# Patient Record
Sex: Male | Born: 1960 | Race: White | Hispanic: No | Marital: Married | Smoking: Former smoker
Health system: Southern US, Community
[De-identification: ages and names within clinical notes are randomized; demographics above are authoritative.]

## PROBLEM LIST (undated history)

## (undated) DIAGNOSIS — M4690 Unspecified inflammatory spondylopathy, site unspecified: Secondary | ICD-10-CM

## (undated) DIAGNOSIS — M1990 Unspecified osteoarthritis, unspecified site: Secondary | ICD-10-CM

## (undated) DIAGNOSIS — E785 Hyperlipidemia, unspecified: Secondary | ICD-10-CM

## (undated) DIAGNOSIS — I4719 Other supraventricular tachycardia: Secondary | ICD-10-CM

## (undated) DIAGNOSIS — I471 Supraventricular tachycardia: Secondary | ICD-10-CM

## (undated) DIAGNOSIS — K219 Gastro-esophageal reflux disease without esophagitis: Secondary | ICD-10-CM

## (undated) HISTORY — PX: RADIOFREQUENCY ABLATION: SHX2290

---

## 2012-07-02 ENCOUNTER — Emergency Department (HOSPITAL_COMMUNITY): Payer: BC Managed Care – PPO

## 2012-07-02 ENCOUNTER — Encounter (HOSPITAL_COMMUNITY): Payer: Self-pay | Admitting: *Deleted

## 2012-07-02 ENCOUNTER — Observation Stay (HOSPITAL_COMMUNITY)
Admission: EM | Admit: 2012-07-02 | Discharge: 2012-07-03 | Disposition: A | Discharge status: Home / Self Care | Payer: BC Managed Care – PPO | Attending: Internal Medicine | Admitting: Internal Medicine

## 2012-07-02 DIAGNOSIS — I471 Supraventricular tachycardia: Secondary | ICD-10-CM | POA: Insufficient documentation

## 2012-07-02 DIAGNOSIS — F32A Depression, unspecified: Secondary | ICD-10-CM

## 2012-07-02 DIAGNOSIS — R0602 Shortness of breath: Secondary | ICD-10-CM

## 2012-07-02 DIAGNOSIS — K219 Gastro-esophageal reflux disease without esophagitis: Secondary | ICD-10-CM | POA: Diagnosis present

## 2012-07-02 DIAGNOSIS — Z79899 Other long term (current) drug therapy: Secondary | ICD-10-CM | POA: Insufficient documentation

## 2012-07-02 DIAGNOSIS — F3289 Other specified depressive episodes: Secondary | ICD-10-CM | POA: Insufficient documentation

## 2012-07-02 DIAGNOSIS — F329 Major depressive disorder, single episode, unspecified: Secondary | ICD-10-CM | POA: Diagnosis present

## 2012-07-02 DIAGNOSIS — E785 Hyperlipidemia, unspecified: Secondary | ICD-10-CM | POA: Diagnosis present

## 2012-07-02 DIAGNOSIS — D72829 Elevated white blood cell count, unspecified: Secondary | ICD-10-CM

## 2012-07-02 DIAGNOSIS — R030 Elevated blood-pressure reading, without diagnosis of hypertension: Secondary | ICD-10-CM

## 2012-07-02 DIAGNOSIS — R0789 Other chest pain: Principal | ICD-10-CM | POA: Insufficient documentation

## 2012-07-02 DIAGNOSIS — R079 Chest pain, unspecified: Secondary | ICD-10-CM | POA: Diagnosis present

## 2012-07-02 HISTORY — DX: Unspecified osteoarthritis, unspecified site: M19.90

## 2012-07-02 HISTORY — DX: Gastro-esophageal reflux disease without esophagitis: K21.9

## 2012-07-02 HISTORY — DX: Supraventricular tachycardia: I47.1

## 2012-07-02 HISTORY — DX: Hyperlipidemia, unspecified: E78.5

## 2012-07-02 HISTORY — DX: Other supraventricular tachycardia: I47.19

## 2012-07-02 HISTORY — DX: Unspecified inflammatory spondylopathy, site unspecified: M46.90

## 2012-07-02 LAB — CBC WITH DIFFERENTIAL/PLATELET
Eosinophils Relative: 0 % (ref 0–5)
HCT: 42.4 % (ref 39.0–52.0)
Hemoglobin: 15.3 g/dL (ref 13.0–17.0)
Lymphocytes Relative: 8 % — ABNORMAL LOW (ref 12–46)
Lymphs Abs: 1.1 10*3/uL (ref 0.7–4.0)
MCV: 90.4 fL (ref 78.0–100.0)
Monocytes Absolute: 1 10*3/uL (ref 0.1–1.0)
Neutro Abs: 10.6 10*3/uL — ABNORMAL HIGH (ref 1.7–7.7)
RBC: 4.69 MIL/uL (ref 4.22–5.81)
WBC: 12.7 10*3/uL — ABNORMAL HIGH (ref 4.0–10.5)

## 2012-07-02 LAB — CBC
HCT: 44.2 % (ref 39.0–52.0)
Hemoglobin: 15.6 g/dL (ref 13.0–17.0)
MCHC: 35.3 g/dL (ref 30.0–36.0)
RBC: 4.8 MIL/uL (ref 4.22–5.81)
WBC: 16.8 10*3/uL — ABNORMAL HIGH (ref 4.0–10.5)

## 2012-07-02 LAB — URINALYSIS, ROUTINE W REFLEX MICROSCOPIC
Bilirubin Urine: NEGATIVE
Glucose, UA: NEGATIVE mg/dL
Ketones, ur: NEGATIVE mg/dL
Leukocytes, UA: NEGATIVE
Nitrite: NEGATIVE
Specific Gravity, Urine: 1.025 (ref 1.005–1.030)
pH: 6.5 (ref 5.0–8.0)

## 2012-07-02 LAB — CARDIAC PANEL(CRET KIN+CKTOT+MB+TROPI)
CK, MB: 1.9 ng/mL (ref 0.3–4.0)
Relative Index: 1.5 (ref 0.0–2.5)
Troponin I: <0.3 ng/mL (ref <0.30)

## 2012-07-02 LAB — BASIC METABOLIC PANEL
BUN: 17 mg/dL (ref 6–23)
CO2: 25 mEq/L (ref 19–32)
Chloride: 98 mEq/L (ref 96–112)
Glucose, Bld: 136 mg/dL — ABNORMAL HIGH (ref 70–99)
Potassium: 3.6 mEq/L (ref 3.5–5.1)

## 2012-07-02 LAB — POCT I-STAT TROPONIN I

## 2012-07-02 MED ORDER — ACETAMINOPHEN 325 MG PO TABS: 650.0000 mg | ORAL_TABLET | Freq: Four times a day (QID) | ORAL | Status: DC | PRN

## 2012-07-02 MED ORDER — MORPHINE SULFATE 2 MG/ML IJ SOLN: 1.0000 mg | INTRAMUSCULAR | Status: DC | PRN

## 2012-07-02 MED ORDER — ENOXAPARIN SODIUM 40 MG/0.4ML ~~LOC~~ SOLN
40.0000 mg | SUBCUTANEOUS | Status: DC
  Administered 2012-07-02: 40 mg via SUBCUTANEOUS
  Filled 2012-07-02 (×2): qty 0.4

## 2012-07-02 MED ORDER — ASPIRIN 325 MG PO TABS
325.0000 mg | ORAL_TABLET | ORAL | Status: AC
  Administered 2012-07-02: 325 mg via ORAL
  Filled 2012-07-02: qty 1

## 2012-07-02 MED ORDER — ONDANSETRON HCL 4 MG/2ML IJ SOLN: 4.0000 mg | Freq: Four times a day (QID) | INTRAMUSCULAR | Status: DC | PRN

## 2012-07-02 MED ORDER — ZOLPIDEM TARTRATE 5 MG PO TABS: 5.0000 mg | ORAL_TABLET | Freq: Every evening | ORAL | Status: DC | PRN

## 2012-07-02 MED ORDER — ADULT MULTIVITAMIN W/MINERALS CH
1.0000 | ORAL_TABLET | Freq: Every day | ORAL | Status: DC
Start: 2012-07-03 — End: 2012-07-03
  Administered 2012-07-03: 1 via ORAL
  Filled 2012-07-02: qty 1

## 2012-07-02 MED ORDER — ALBUTEROL SULFATE (5 MG/ML) 0.5% IN NEBU: 2.5000 mg | INHALATION_SOLUTION | RESPIRATORY_TRACT | Status: DC | PRN

## 2012-07-02 MED ORDER — ACETAMINOPHEN 650 MG RE SUPP: 650.0000 mg | Freq: Four times a day (QID) | RECTAL | Status: DC | PRN

## 2012-07-02 MED ORDER — ESCITALOPRAM OXALATE 10 MG PO TABS
10.0000 mg | ORAL_TABLET | Freq: Two times a day (BID) | ORAL | Status: DC
  Administered 2012-07-02 – 2012-07-03 (×2): 10 mg via ORAL
  Filled 2012-07-02 (×3): qty 1

## 2012-07-02 MED ORDER — ONDANSETRON HCL 4 MG PO TABS: 4.0000 mg | ORAL_TABLET | Freq: Four times a day (QID) | ORAL | Status: DC | PRN

## 2012-07-02 MED ORDER — NITROGLYCERIN 0.4 MG SL SUBL
0.4000 mg | SUBLINGUAL_TABLET | SUBLINGUAL | Status: DC | PRN
  Administered 2012-07-02: 0.4 mg via SUBLINGUAL

## 2012-07-02 MED ORDER — ATORVASTATIN CALCIUM 10 MG PO TABS
10.0000 mg | ORAL_TABLET | Freq: Every day | ORAL | Status: DC
  Filled 2012-07-02: qty 1

## 2012-07-02 MED ORDER — ALUM & MAG HYDROXIDE-SIMETH 200-200-20 MG/5ML PO SUSP: 30.0000 mL | Freq: Four times a day (QID) | ORAL | Status: DC | PRN

## 2012-07-02 MED ORDER — SODIUM CHLORIDE 0.9 % IJ SOLN
3.0000 mL | Freq: Two times a day (BID) | INTRAMUSCULAR | Status: DC
  Administered 2012-07-02 – 2012-07-03 (×2): 3 mL via INTRAVENOUS

## 2012-07-02 MED ORDER — ASPIRIN EC 81 MG PO TBEC
81.0000 mg | DELAYED_RELEASE_TABLET | Freq: Every day | ORAL | Status: DC
  Administered 2012-07-02 – 2012-07-03 (×2): 81 mg via ORAL
  Filled 2012-07-02 (×2): qty 1

## 2012-07-02 MED ORDER — ZOLPIDEM TARTRATE 10 MG PO TABS
10.0000 mg | ORAL_TABLET | Freq: Once | ORAL | Status: AC
  Administered 2012-07-02: 10 mg via ORAL
  Filled 2012-07-02: qty 1

## 2012-07-02 NOTE — Progress Notes (Signed)
Report given to [place], Charity fundraiser . Questions answered. Pt ready for transport

## 2012-07-02 NOTE — ED Notes (Signed)
MD at bedside. 

## 2012-07-02 NOTE — Progress Notes (Signed)
[facility] ED CM reviewed EPIC labs, imaging and notes

## 2012-07-02 NOTE — Consult Note (Signed)
Consult Note    Patient ID: Aaron Barry MRN: [id number], DOB/AGE: 01-Dec-1961   Admit date: 07/02/2012 Date of Consult: 07/02/2012   Primary Physician: Roland Rack, MD Primary Cardiologist: New to cardiology  Pt. Profile: Aaron Barry is a 51yo male with PMHx significant for SVT (s/p RFA x 2 in "early 1990s" at [facility]), hyperlipidemia, GERD and spondylitis who presents to [facility] ED with c/o chest pain.   HPI:   The patient reports sitting at his desk this morning and suddenly experiencing left-sided burning chest pain without radiation rated at a 7-8/10 lasting for several minutes with associated chills, myalgias, arthralgias, diaphoresis, pallor and shortness of breath. No association with exertion. No prior episodes. He denies experiencing these same associated symptoms leading up to today. He denies palpitations, lightheadedness, fevers, n/v, PND, LE edema, orthopnea, new cough. He took an indomethacin tablet without relief. The pain remitted to a 4-5/10 and recurred at the same intensity, thus prompting his presentation to [facility] ED. He denies unilateral leg swelling, redness or tenderness. He denies active bleeding. No wheezing. No tobacco abuse. Occasional EtOH. No elicit drugs. No family history of cardiac issues. He is fairly active at baseline. He runs 2-3 miles a day without complaints of chest pain, DOE or lightheadedness. He does state that very infrequently (once a year) he still endorses tachy-palpitations that improve with rest. He reports undergoing a Lexiscan Myoview 6-7 years ago which was "normal." He states that his cholesterol and thyroid function has not been checked in a while. He does occasionally experience indigestion, but states today's episodes are different.  Upon ED arrival, he received full-dose ASA x 1 and NTG SL with unchanged chest pressure. EKG reveals NSR with no evidence of ischemia. POC troponin-I WNL. CBC reveals a leukocytosis of 16.8. BMET WNL. VSS, afebrile.  Currently chest pain free in NAD.   Problem List: Past Medical History  Diagnosis Date  . Hyperlipemia   . Arthritis   . Spondylitis   . AV nodal re-entry tachycardia     Past Surgical History  Procedure Date  . Radiofrequency ablation 1999    At [facility] for re-entry tachycardia      Allergies: No Known Allergies  Home Medications: Prior to Admission medications   Medication Sig Start Date End Date Taking? Authorizing Provider  atorvastatin (LIPITOR) 10 MG tablet Take 10 mg by mouth daily.   Yes Historical Provider, MD  escitalopram (LEXAPRO) 10 MG tablet Take 10 mg by mouth 2 (two) times daily.   Yes Historical Provider, MD  indomethacin (INDOCIN SR) 75 MG CR capsule Take 75 mg by mouth once.   Yes Historical Provider, MD  Multiple Vitamin (MULTIVITAMIN WITH MINERALS) TABS Take 1 tablet by mouth daily.   Yes Historical Provider, MD    Inpatient Medications:     . aspirin  325 mg Oral STAT    (Not in a hospital admission)  No family history on file.   History   Social History  . Marital Status: Married    Spouse Name: N/A    Number of Children: N/A  . Years of Education: N/A   Occupational History  . Not on file.   Social History Main Topics  . Smoking status: Former Games developer  . Smokeless tobacco: Not on file  . Alcohol Use: Yes     soc  . Drug Use: No  . Sexually Active:    Other Topics Concern  . Not on file   Social History Narrative  . No  narrative on file     Review of Systems: General: positive for chills, diaphoresis, pallor, dry mouth, myalgias, arthralgias, negative for fever, decreased appetitie or weight changes.  Cardiovascular: positive for chest pain, shortness of breath, negative for dyspnea on exertion, edema, orthopnea, palpitations, paroxysmal nocturnal dyspnea Dermatological: negative for rash Respiratory: negative for cough or wheezing Urologic: negative for hematuria Abdominal: negative for nausea, vomiting, diarrhea, bright red  blood per rectum, melena, or hematemesis Neurologic:  negative for visual changes, syncope, or dizziness All other systems reviewed and are otherwise negative except as noted above.  Physical Exam: Blood pressure 114/73, pulse 77, temperature 97.7 F (36.5 C), temperature source Oral, resp. rate 14, SpO2 95.00%.    General: Well developed, well nourished, in no acute distress. Head: Normocephalic, atraumatic, sclera non-icteric, no xanthomas, nares are without discharge.  Neck: Negative for carotid bruits. JVD not elevated. Lungs: Clear bilaterally to auscultation without wheezes, rales, or rhonchi. Breathing is unlabored. Heart: RRR with S1 S2. No murmurs, rubs, or gallops appreciated. Abdomen: Soft, non-tender, non-distended with normoactive bowel sounds. No hepatomegaly. No rebound/guarding. No obvious abdominal masses. Msk:  Strength and tone appears normal for age. Extremities: No clubbing, cyanosis or edema.  Distal pedal pulses are 2+ and equal bilaterally. Neuro:  Alert and oriented X 3. Moves all extremities spontaneously. Psych:  Responds to questions appropriately with a normal affect.  Labs: Recent Labs  Basename 07/02/12 1355   WBC 16.8*   HGB 15.6   HCT 44.2   MCV 92.1   PLT 181   Lab 07/02/12 1355  NA 135  K 3.6  CL 98  CO2 25  BUN 17  CREATININE 1.21  CALCIUM 8.9  PROT --  BILITOT --  ALKPHOS --  ALT --  AST --  AMYLASE --  LIPASE --  GLUCOSE 136*   Radiology/Studies: No results found.  EKG: NSR, 91 bpm, no ST-T wave changes  ASSESSMENT:   1. Chest pain 2. Leukocytosis 3. Hyperlipidemia  4. History of SVT  DISCUSSION/PLAN:   The patient's chest pain is mostly atypical for a cardiac etiology. He is a fairly active individual who runs 2-3 miles daily, and has not endorsed chest pain, DOE or lightheadedness with this. His chest pain occurred suddenly this morning with associated chills, myalgias, arthralgias, dry mouth, pallor and shortness of  breath. Non-exertional and described as burning. No palpitations. He does not relate this to prior reflux type symptoms. EKG reveals no evidence of ischemia. Cardiac biomarkers WNL in the ED. He does have a leukocytosis on CBC. VSS, afebrile. The may likely represent an infectious etiology (flu vs developing bronchitis or pneumonia on DDx). 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 Will call medicine service for admission and further management. Given history of hyperlipidemia and chest pain quality with some typical components, would rule out for ACS. Plan for Myoview in the AM. Will get CXR in the ED. Would assess lipid panel and TSH since these have not been checked in a while. Check A1C. Monitor on telemetry for any instances of arrhythmia given SVT history (unlikely as he rarely experiences tachy-palpitations). Will discuss further recommendations with MD. Will order differential and u/a per medicine request. Patient to be admitted to observation.    Signed, R. Hurman Horn, PA-C 07/02/2012, 4:12 PM    I have seen, examined the patient, and reviewed the above assessment and plan.  Changes to above are made where necessary.  The patient presents with abrupt  chest pain with both typical and atypical features.  Chills, nausea, and myalgias are suggestive of a viral prodrome.  He will be admitted to Internal Medicine for overnight observation. If he rules out for MI, then I would recommend GXT myoview for further cardiac risk stratification in am.  Co Sign: Hillis Range, MD 07/02/2012 9:03 PM

## 2012-07-02 NOTE — ED Notes (Signed)
Sudden onset around 11 am of chest pain, pt c/o dry mouth, light headed, lethargic feeling, generalized weakness, nausea, shortness of breath

## 2012-07-02 NOTE — ED Provider Notes (Signed)
History     CSN: [id number]  Arrival date & time 07/02/12  1330   First MD Initiated Contact with Patient 07/02/12 1427      Chief Complaint  Patient presents with  . Chest Pain    (Consider location/radiation/quality/duration/timing/severity/associated sxs/prior treatment) HPI  51 year old man who presents with acute onset chest pain starting at 11:00 AM. The pain was described as a burning and pressure located to the left of his sternum. It was an 8/10 in intensity. It did not radiate. It was accompanied by diaphoresis, nausea, vomiting, generalized muscle soreness, and chills. He took indomethacin to try to relieve the pain, but it was unsuccessful. The incident occurred while the patient was at work, seated at a desk. He denies any relationship to exertion. He notes stress in the work place and home, but notes that he was not particularly anxious or distressed prior to the event. 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 This was successfully treated with ablation at [facility] in the 1990's. He has not been to see a cardiologist in approximately 10 years. His other risk factor for CAD includes hyperlipidemia. He does not smoke, does not have hypertension, does not have diabetes, and does not have any family history of MI. Currently, the patient notes that this pain has subsided to a 4/10, and he feels extremely fatigued.   Past Medical History  Diagnosis Date  . Hyperlipemia   . Arthritis   . Spondylitis   . AV nodal re-entry tachycardia     Past Surgical History  Procedure Date  . Radiofrequency ablation 1999    At [facility] for re-entry tachycardia     No family history on file.  History  Substance Use Topics  . Smoking status: Former Games developer  . Smokeless tobacco: Not on file  . Alcohol Use: Yes     soc      Review of Systems  Constitutional: Positive for chills, diaphoresis and fatigue. Negative for fever.  HENT: Negative.   Eyes: Negative.    Respiratory: Positive for chest tightness.   Cardiovascular: Positive for chest pain. Negative for palpitations.  Gastrointestinal: Negative.   Genitourinary: Negative.   Musculoskeletal: Positive for arthralgias.  Neurological: Positive for light-headedness.  Hematological: Negative.   Psychiatric/Behavioral: Negative.     Allergies  Review of patient's allergies indicates no known allergies.  Home Medications   Current Outpatient Rx  Name Route Sig Dispense Refill  . ATORVASTATIN CALCIUM 10 MG PO TABS Oral Take 10 mg by mouth daily.    Marland Kitchen ESCITALOPRAM OXALATE 10 MG PO TABS Oral Take 10 mg by mouth 2 (two) times daily.    . INDOMETHACIN ER 75 MG PO CPCR Oral Take 75 mg by mouth once.    . ADULT MULTIVITAMIN W/MINERALS CH Oral Take 1 tablet by mouth daily.      BP 114/73  Pulse 77  Temp 97.7 F (36.5 C) (Oral)  Resp 14  SpO2 95%  Physical Exam  Constitutional: He is oriented to person, place, and time. Vital signs are normal. He appears well-developed and well-nourished. He appears ill. He appears distressed.  HENT:  Head: Normocephalic and atraumatic.  Mouth/Throat: Oropharynx is clear and moist. No oropharyngeal exudate.  Eyes: Conjunctivae and EOM are normal. Pupils are equal, round, and reactive to light.  Neck: Normal range of motion. Neck supple.  Cardiovascular: Normal rate and regular rhythm.   No murmur heard. Pulmonary/Chest: Effort normal and breath  sounds normal. He exhibits no tenderness.  Abdominal: Soft. Bowel sounds are normal.  Musculoskeletal: He exhibits no edema.  Neurological: He is alert and oriented to person, place, and time. He has normal strength and normal reflexes. No cranial nerve deficit.  Skin: No rash noted. He is diaphoretic. There is pallor.    ED Course  Procedures (including critical care time)  Labs Reviewed  CBC - Abnormal; Notable for the following:    WBC 16.8 (*)     All other components within normal limits  BASIC  METABOLIC PANEL - Abnormal; Notable for the following:    Glucose, Bld 136 (*)     GFR calc non Af Amer 67 (*)     GFR calc Af Amer 78 (*)     All other components within normal limits  POCT I-STAT TROPONIN I    Date: 07/02/2012  Rate: 91  Rhythm: normal sinus rhythm  QRS Axis: normal  Intervals: normal  ST/T Wave abnormalities: normal  Conduction Disutrbances:none  Narrative Interpretation: normal ECG  Old EKG Reviewed: none available    1. Chest pain with high risk for cardiac etiology       MDM  51 year old male with history of re-entry tachycardia who presents with chest pain that is suspicious for cardiac origin who would benefit from a ACS rule out and risk stratification. Therefore, he will be admitted to the hospital.         Garnetta Buddy, MD 07/02/12 [id number]

## 2012-07-02 NOTE — H&P (Signed)
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 Both the age and the burning appeared to increase and decrease with severity going from a 5 to an 8/10. He also had some associated shortness of breath and cough-nonproductive. He also felt general malaise and felt he was going to pass out. He had no radiation of symptoms. He talked to his manager he was brought into the emergency room. There he was noted to have a basically unremarkable EKG and chest x-ray. Lab work did note a white count of 16.8 but the patient had no fever. Initial cardiac markers were negative. [facility] were called for further evaluation and admission.  Review of Systems:  When I saw the patient in the emergency room, he was feeling all better. He states that since she's been in the emergency room, the burning is gone completely away. He still has a mild ache which he describes it mostly as a 2/10. His shortness of breath has also resolved. He has no cough. He otherwise is feeling better and denies any headaches, vision changes, dysphasia, palpitations, wheezing, abdominal pain, hematuria, dysuria, constipation, diarrhea, focal extremity numbness or weakness or pain. His review of systems is otherwise negative.  Past Medical History  Diagnosis Date  . Hyperlipemia   . Arthritis   . Spondylitis   . AV nodal re-entry tachycardia   . GERD (gastroesophageal reflux disease)    Past Surgical History  Procedure Date  . Radiofrequency ablation  1997,approx mid 90's    At [facility] for re-entry tachycardia    Social History:  reports that he quit smoking about 15 years ago. He has never used smokeless tobacco. He reports that he drinks alcohol. He reports that he does not use illicit drugs.  No Known Allergies  Mom with history of ovarian cancer  Prior to Admission medications   Medication Sig Start Date End Date Taking? Authorizing Provider  atorvastatin (LIPITOR) 10 MG tablet Take 10 mg by mouth daily.   Yes Historical Provider, MD  escitalopram (LEXAPRO) 10 MG tablet Take 10 mg by mouth 2 (two) times daily.   Yes Historical Provider, MD  indomethacin (INDOCIN SR) 75 MG CR capsule Take 75 mg by mouth once.   Yes Historical Provider, MD  Multiple Vitamin (MULTIVITAMIN WITH MINERALS) TABS Take 1 tablet by mouth daily.   Yes Historical Provider, MD   Physical Exam: Filed Vitals:   07/02/12 1337 07/02/12 1532 07/02/12 1916  BP: 120/66 114/73 113/73  Pulse: 94 77 73  Temp: 97.7 F (36.5 C)    TempSrc: Oral    Resp: 18 14 16   SpO2: 94% 95% 98%     General:  Alert and oriented x3, no acute distress, fatigued, looks about stated age  Eyes: Extraocular movements intact, nonicteric  ENT: Normocephalic, atraumatic, mucous membranes are slightly dry  Neck: Supple, no thyromegaly no JVD  Cardiovascular: Regular rate and rhythm, S1-S2  Respiratory: Clear to auscultation bilaterally  Abdomen: Soft, nontender, nondistended, positive bowel sounds  Skin: No erythema or lacerations or skin tears  Musculoskeletal: Symmetric and equal. No weakness. 5 out of 5 bilaterally in upper and lower  Psychiatric: No symptoms of any acute psychoses. He does not appear to be actively depressed  Neurologic: No focal neurologic deficits  Labs on Admission:  Basic Metabolic Panel:  Lab 07/02/12 4098  NA 135  K 3.6  CL 98  CO2 25  GLUCOSE 136*  BUN 17  CREATININE 1.21  CALCIUM 8.9  MG --  PHOS --   CBC:  Lab 07/02/12 1810 07/02/12  1355  WBC 12.7* 16.8*  NEUTROABS 10.6* --  HGB 15.3 15.6  HCT 42.4 44.2  MCV 90.4 92.1  PLT 169 181   Cardiac Enzymes: No results found for this basename: CKTOTAL:5,CKMB:5,CKMBINDEX:5,TROPONINI:5 in the last 168 hours Pro BNP: 5  Radiological Exams on Admission: Dg Chest 2 View 07/02/2012   IMPRESSION: No active cardiopulmonary disease.  Original Report Authenticated By: Donavan Burnet, M.D.    EKG: Independently reviewed. Normal sinus rhythm  Assessment/Plan Principal Problem:  *Chest pain: Given the sudden onset and atypical symptoms, this possibly could be an acute viral illness. We'll cycle enzymes x3. His risk factors include hyperlipidemia and age. We'll discuss with cardiology about whether he needs a stress test inpatient versus outpatient.  Active Problems:  GERD (gastroesophageal reflux disease): Stable. Patient occasionally uses his wife's PPI if he has symptoms. He states that his symptoms today or in no way representative of what it is likely has had GERD.   Hyperlipidemia: Check fasting lipid profile in the morning as he has not had a lipid test in over a year. Continue Lipitor.   Leukocytosis: Appears to be stress margination. Secondary white blood cell count is down to 12 although noted he did have a shift on his differential. Recheck labs in the morning   Elevated blood pressure (not hypertension): We'll continue to monitor. No history of high blood pressure. His blood pressure remains elevated, will recommend to his PCP about followup and possible starting antihypertensive medications.   Shortness of breath: Looks of unresolved. Possible bronchospasm   Depression: Stable. Continue Lexapro.   Code Status: Full code  Family Communication: Patient's wife was present and amenable and agreeable to this plan.  Disposition Plan: Stable and I suspected likely he will be discharged home tomorrow.  Hollice Espy [facility] Pager [phone]  If 7PM-7AM,  please contact night-coverage [email or web address] Password [email or web address] 07/02/2012, 7:40 PM

## 2012-07-02 NOTE — ED Notes (Signed)
Attempted to call report to [address]. RN unavailable at this time. Callback number left. Will re-attempt to call report if call not returned

## 2012-07-03 ENCOUNTER — Observation Stay (HOSPITAL_COMMUNITY): Payer: BC Managed Care – PPO

## 2012-07-03 DIAGNOSIS — R079 Chest pain, unspecified: Secondary | ICD-10-CM

## 2012-07-03 DIAGNOSIS — F3289 Other specified depressive episodes: Secondary | ICD-10-CM

## 2012-07-03 DIAGNOSIS — F329 Major depressive disorder, single episode, unspecified: Secondary | ICD-10-CM

## 2012-07-03 DIAGNOSIS — K219 Gastro-esophageal reflux disease without esophagitis: Secondary | ICD-10-CM

## 2012-07-03 LAB — LIPID PANEL
Cholesterol: 113 mg/dL (ref 0–200)
LDL Cholesterol: 50 mg/dL (ref 0–99)
Total CHOL/HDL Ratio: 3.3 RATIO
Triglycerides: 146 mg/dL (ref <150)
VLDL: 29 mg/dL (ref 0–40)

## 2012-07-03 LAB — CBC
HCT: 42.9 % (ref 39.0–52.0)
Hemoglobin: 15.1 g/dL (ref 13.0–17.0)
MCH: 32.3 pg (ref 26.0–34.0)
MCHC: 35.2 g/dL (ref 30.0–36.0)
RBC: 4.67 MIL/uL (ref 4.22–5.81)

## 2012-07-03 LAB — CARDIAC PANEL(CRET KIN+CKTOT+MB+TROPI)
CK, MB: 1.6 ng/mL (ref 0.3–4.0)
CK, MB: 1.6 ng/mL (ref 0.3–4.0)
Troponin I: <0.3 ng/mL (ref <0.30)
Troponin I: <0.3 ng/mL (ref <0.30)

## 2012-07-03 MED ORDER — TECHNETIUM TC 99M TETROFOSMIN IV KIT
30.0000 | PACK | Freq: Once | INTRAVENOUS | Status: AC | PRN
  Administered 2012-07-03: 30 via INTRAVENOUS

## 2012-07-03 MED ORDER — TECHNETIUM TC 99M TETROFOSMIN IV KIT
10.0000 | PACK | Freq: Once | INTRAVENOUS | Status: AC | PRN
  Administered 2012-07-03: 10 via INTRAVENOUS

## 2012-07-03 NOTE — Progress Notes (Signed)
Brief Nutrition Note  Reason: Nutrition Risk for dysphagia  Spoke with patient's wife, she reported patient has a good appetite and intake. She reported patient does not have any difficulty chewing or swallowing. Patient is not at nutrition risk at this time.   RD available for nutrition needs.   Aaron Barry [facility] [phone]

## 2012-07-03 NOTE — Progress Notes (Signed)
   CARE MANAGEMENT NOTE 07/03/2012  Patient:  [facility]   Account Number:  [id number]  Date Initiated:  07/03/2012  Documentation initiated by:  Jiles Crocker  Subjective/Objective Assessment:   ADMITTED TO OBSERVATION FOR CHEST PAIN     Action/Plan:   PCP: Roland Rack, MD  INDEPENDENT PRIOR TO ADMISSION   Anticipated DC Date:  07/04/2012   Anticipated DC Plan:  HOME/SELF CARE      DC Planning Services  CM consult               Status of service:  In process, will continue to follow Medicare Important Message given?   (If response is "NO", the following Medicare IM given date fields will be blank)  Per UR Regulation:  Reviewed for med. necessity/level of care/duration of stay  Comments:  07/03/2012- B Lilyrose Tanney RN, BSN, MHA

## 2012-07-03 NOTE — Progress Notes (Signed)
Pt is having very frequent missed beats/pauses of about 1.3 seconds. Pt reports that this has been occuring since his ablation procedure in the 90's. Pt's VS remain stable, and pt is resting in bed. Pt does report that he feels the first beat after a pause, but that this is normal for him. On-call Cardiologist was paged and confirmed that this was normal, no new orders. Will continue to monitor patient.

## 2012-07-03 NOTE — ED Provider Notes (Signed)
  I performed a history and physical examination of Aaron Barry and discussed his management with Dr. Clinton Sawyer.  I agree with the history, physical, assessment, and plan of care, with the following exceptions: None  I was present for the following procedures: None Time Spent in Critical Care of the patient: None Time spent in discussions with the patient and family: 10  I have also seen the ECG and agree with the interpretation.  Elyse Jarvis, MD 07/03/12 0005

## 2012-07-03 NOTE — Progress Notes (Signed)
    Subjective:  Had an ache in chest overnight, felt like a 'muscle pull.' no dyspnea or other complaints. Overall feels much better this am.   Objective:  Vital Signs in the last 24 hours: Temp:  [97.7 F (36.5 C)-98.3 F (36.8 C)] 98 F (36.7 C) (07/11 0544) Pulse Rate:  [65-94] 69  (07/11 0544) Resp:  [14-18] 16  (07/11 0544) BP: (113-127)/(66-83) 127/83 mmHg (07/11 0544) SpO2:  [93 %-98 %] 95 % (07/11 0544) Weight:  [114.4 kg (252 lb 3.3 oz)-115.6 kg (254 lb 13.6 oz)] 114.4 kg (252 lb 3.3 oz) (07/11 0500)  Intake/Output from previous day: 07/10 0701 - 07/11 0700 In: -  Out: 750 [Urine:750]  Physical Exam: Pt is alert and oriented, NAD HEENT: normal Neck: JVP - normal, carotids 2+= without bruits Lungs: CTA bilaterally CV: RRR without murmur or gallop Abd: soft, NT, Positive BS, no hepatomegaly Ext: no C/C/E, distal pulses intact and equal Skin: warm/dry no rash   Lab Results:  Basename 07/02/12 1810 07/02/12 1355  WBC 12.7* 16.8*  HGB 15.3 15.6  PLT 169 181    Basename 07/02/12 1355  NA 135  K 3.6  CL 98  CO2 25  GLUCOSE 136*  BUN 17  CREATININE 1.21    Basename 07/03/12 0059 07/02/12 1810  TROPONINI <0.30 <0.30   Tele: personally reviewed, NSR no arrhythmia  Assessment/Plan:  Chest pain with typical and atypical features. No objective evidence of ischemia. Plan stress Myoview today. If Myoview normal then no further cardiac evaluation necessary. Dr Jenel Lucks consult note reviewed and I agree that his symptoms are most likely related to a viral syndrome, but prudent to rule out cardiac etiology of chest pain.   Tonny Bollman, M.D. 07/03/2012, 7:00 AM

## 2012-07-03 NOTE — Progress Notes (Signed)
   CARE MANAGEMENT NOTE 07/03/2012  Patient:  Aaron Barry   Account Number:  [id number]  Date Initiated:  07/03/2012  Documentation initiated by:  Jiles Crocker  Subjective/Objective Assessment:   ADMITTED WITH ATRIAL FIB, UTI     Action/Plan:   PATIENT RESIDES IN A MEMORY CARE UNIT; SOCIAL WORKER REFERRAL PLACED   Anticipated DC Date:  07/10/2012   Anticipated DC Plan:  SKILLED NURSING FACILITY  In-house referral  Clinical Social Worker      DC Planning Services  CM consult               Status of service:  In process, will continue to follow Medicare Important Message given?  NA - LOS <3 / Initial given by admissions (If response is "NO", the following Medicare IM given date fields will be blank)  Per UR Regulation:  Reviewed for med. necessity/level of care/duration of stay  Comments:  7/11/2-13[facility] RN, BSN, MHA

## 2012-07-03 NOTE — Discharge Summary (Signed)
Physician Discharge Summary  Aaron Barry ZOX:[id number] DOB: 08-06-1961 DOA: 07/02/2012  PCP: Roland Rack, MD  Admit date: 07/02/2012 Discharge date: 07/03/2012  Recommendations for Outpatient Follow-up:  1. Followup with primary care physician  Discharge Diagnoses:  Principal Problem:  *Chest pain Active Problems:  GERD (gastroesophageal reflux disease)  Hyperlipidemia  Leukocytosis  Elevated blood pressure (not hypertension)  Shortness of breath  Depression   1. Chest pain  Discharge Condition: Stable  Diet recommendation: Heart healthy  History of present illness:  Patient is a 51 year old white male past medical history hyperlipidemia, SVT status post ablation and depression who has been in his usual state of health when today at work (he works at [employer]) he felt sudden onset severe chest discomfort to the left of midsternal which he described as an ache but also burning. Both the age and the burning appeared to increase and decrease with severity going from a 5 to an 8/10. He also had some associated shortness of breath and cough-nonproductive. He also felt general malaise and felt he was going to pass out. He had no radiation of symptoms. He talked to his manager he was brought into the emergency room. There he was noted to have a basically unremarkable EKG and chest x-ray. Lab work did note a white count of 16.8 but the patient had no fever. Initial cardiac markers were negative. [facility] were called for further evaluation and admission.   Hospital Course:  After the patient admitted to the hospital, cardiology was consulted. They thought that the chest pain is secondary to probable viral infection. As patient does not have any x-ray findings of pneumonia or other acute pulmonary disease. Because of patient persistent pain, stress Myoview test was done. It was reported to be clinically positive and radiologically negative scans. I discussed this with cardiology and  basically they said patient is low risk for cardiac ischemia and the perfusion scans did not show any ischemia, so the pain he had is likely noncardiac chest pain. He is chest pain-free, he wanted to go home after the stress test, he was deemed safe to be discharged by cardiology and paperwork was done. He is to continue his other home medications and to followup with his primary care physician.  Procedures:  Myoview stress test, see below for full report  Consultations:  [facility]  Discharge Exam: Filed Vitals:   07/03/12 1709  BP: 135/73  Pulse: 79  Temp:   Resp:    Filed Vitals:   07/03/12 1208 07/03/12 1210 07/03/12 1411 07/03/12 1709  BP: 177/80 145/75 115/75 135/73  Pulse:   70 79  Temp:   98.3 F (36.8 C)   TempSrc:   Oral   Resp:   17   Height:      Weight:      SpO2:   94%    General: Alert and awake, oriented x3, not in any acute distress. HEENT: anicteric sclera, pupils reactive to light and accommodation, EOMI CVS: S1-S2 clear, no murmur rubs or gallops Chest: clear to auscultation bilaterally, no wheezing, rales or rhonchi Abdomen: soft nontender, nondistended, normal bowel sounds, no organomegaly Extremities: no cyanosis, clubbing or edema noted bilaterally Neuro: Cranial nerves II-XII intact, no focal neurological deficits   Discharge Instructions  Discharge Orders    Future Orders Please Complete By Expires   Diet - low sodium heart healthy      Increase activity slowly        Medication List  As of 07/03/2012  6:45 PM   TAKE these medications         atorvastatin 10 MG tablet   Commonly known as: LIPITOR   Take 10 mg by mouth daily.      escitalopram 10 MG tablet   Commonly known as: LEXAPRO   Take 10 mg by mouth 2 (two) times daily.      indomethacin 75 MG CR capsule   Commonly known as: INDOCIN SR   Take 75 mg by mouth once.      multivitamin with minerals Tabs   Take 1 tablet by mouth daily.           Follow-up  Information    Follow up with ABBOTT,THOMAS, MD in 1 week.          The results of significant diagnostics from this hospitalization (including imaging, microbiology, ancillary and laboratory) are listed below for reference.    Significant Diagnostic Studies: Dg Chest 2 View  07/02/2012  *RADIOLOGY REPORT*  Clinical Data: Chest pain  CHEST - 2 VIEW  Comparison: None.  Findings: Heart is upper normal in size allowing for low lung volumes.  Grossly clear.  Interstitial prominence and bronchitic changes are likely chronic.  Increased AP diameter of the chest. No pneumothorax and no pleural effusion.  IMPRESSION: No active cardiopulmonary disease.  Original Report Authenticated By: Donavan Burnet, M.D.   Nm Myocar Multi W/spect W/wall Motion / Ef  07/03/2012  Identification the patient is a 51 year old with chest pain test to evaluate rule out ischemia.  Stress data:  The patient exercised in the Bruce protocol baseline EKG showed sinus rhythm 75 beats per minute.  The patient exercised for 9 minutes to a peak heart rate of 157 which was 93% predicted maximal peak blood pressure 182/86.  He complained of  mild chest pain this resulted recovery.  EKG showed no changes to suggest ischemia.  Nuclear data:  The patient was studied in 1-day rest stress protocol, injected with 10 mCi technetium 99 labeled tetrofosmin at rest; 30 mCi technetium 99 labeled tetrofosmin at stress.  Images were reconstructed the short, vertical, horizontal axes.  In the initial stress images there is mild thinning noted in the inferior wall (base, mid, minimally distal) otherwise normal perfusion.  In the recovery images there is no significant change.  On review of the raw data, soft tissue (diaphragm, bowel activity) underlies the inferior wall.  On gating LVEF was calculated at 56% with normal wall motion.  Impression:  Stress Myoview.  Clinically positive electrically negative for ischemia.  Myoview scan with inferior changes  consistent with probable soft tissue attenuation and normal perfusion.  No evidence for fixed significant ischemia or scar. Overall low risk scan.  LVEF on gating calculated 56%.  Original Report Authenticated By: [email or web address]    Microbiology: No results found for this or any previous visit (from the past 240 hour(s)).   Labs: Basic Metabolic Panel:  Lab 07/02/12 6213  NA 135  K 3.6  CL 98  CO2 25  GLUCOSE 136*  BUN 17  CREATININE 1.21  CALCIUM 8.9  MG --  PHOS --   Liver Function Tests: No results found for this basename: AST:5,ALT:5,ALKPHOS:5,BILITOT:5,PROT:5,ALBUMIN:5 in the last 168 hours No results found for this basename: LIPASE:5,AMYLASE:5 in the last 168 hours No results found for this basename: AMMONIA:5 in the last 168 hours CBC:  Lab 07/03/12 0650 07/02/12 1810 07/02/12 1355  WBC 7.5 12.7* 16.8*  NEUTROABS -- 10.6* --  HGB 15.1 15.3 15.6  HCT 42.9 42.4 44.2  MCV 91.9 90.4 92.1  PLT 142* 169 181   Cardiac Enzymes:  Lab 07/03/12 0650 07/03/12 0059 07/02/12 1810  CKTOTAL 112 115 123  CKMB 1.6 1.6 1.9  CKMBINDEX -- -- --  TROPONINI <0.30 <0.30 <0.30   BNP: BNP (last 3 results) No results found for this basename: PROBNP:3 in the last 8760 hours CBG: No results found for this basename: GLUCAP:5 in the last 168 hours  Time coordinating discharge: 40 minutes  Signed:  Karma Ansley A  [facility] 07/03/2012, 6:45 PM

## 2017-01-15 ENCOUNTER — Encounter (HOSPITAL_COMMUNITY): Payer: Self-pay | Admitting: Emergency Medicine

## 2017-01-15 ENCOUNTER — Emergency Department (HOSPITAL_COMMUNITY): Payer: Managed Care, Other (non HMO)

## 2017-01-15 ENCOUNTER — Emergency Department (HOSPITAL_COMMUNITY)
Admission: EM | Admit: 2017-01-15 | Discharge: 2017-01-16 | Disposition: A | Discharge status: Home / Self Care | Payer: Managed Care, Other (non HMO) | Attending: Emergency Medicine | Admitting: Emergency Medicine

## 2017-01-15 DIAGNOSIS — R197 Diarrhea, unspecified: Secondary | ICD-10-CM

## 2017-01-15 DIAGNOSIS — Z87891 Personal history of nicotine dependence: Secondary | ICD-10-CM | POA: Insufficient documentation

## 2017-01-15 DIAGNOSIS — R112 Nausea with vomiting, unspecified: Secondary | ICD-10-CM | POA: Insufficient documentation

## 2017-01-15 LAB — URINALYSIS, ROUTINE W REFLEX MICROSCOPIC
Bacteria, UA: NONE SEEN
Bilirubin Urine: NEGATIVE
Glucose, UA: NEGATIVE mg/dL
Hgb urine dipstick: NEGATIVE
KETONES UR: 5 mg/dL — AB
Leukocytes, UA: NEGATIVE
Nitrite: NEGATIVE
Protein, ur: 30 mg/dL — AB
SPECIFIC GRAVITY, URINE: 1.027 (ref 1.005–1.030)
pH: 5 (ref 5.0–8.0)

## 2017-01-15 LAB — CBC
HCT: 46.2 % (ref 39.0–52.0)
Hemoglobin: 16.7 g/dL (ref 13.0–17.0)
MCH: 33.6 pg (ref 26.0–34.0)
MCHC: 36.1 g/dL — AB (ref 30.0–36.0)
MCV: 93 fL (ref 78.0–100.0)
Platelets: 185 10*3/uL (ref 150–400)
RBC: 4.97 MIL/uL (ref 4.22–5.81)
RDW: 13.8 % (ref 11.5–15.5)
WBC: 19.7 10*3/uL — ABNORMAL HIGH (ref 4.0–10.5)

## 2017-01-15 MED ORDER — LOPERAMIDE HCL 2 MG PO CAPS
4.0000 mg | ORAL_CAPSULE | Freq: Once | ORAL | Status: AC
  Administered 2017-01-16: 4 mg via ORAL
  Filled 2017-01-15: qty 2

## 2017-01-15 MED ORDER — SODIUM CHLORIDE 0.9 % IV BOLUS (SEPSIS)
1000.0000 mL | Freq: Once | INTRAVENOUS | Status: AC
  Administered 2017-01-16: 1000 mL via INTRAVENOUS

## 2017-01-15 MED ORDER — ONDANSETRON HCL 4 MG/2ML IJ SOLN
4.0000 mg | Freq: Once | INTRAMUSCULAR | Status: AC
  Administered 2017-01-16: 4 mg via INTRAVENOUS
  Filled 2017-01-15: qty 2

## 2017-01-15 MED ORDER — ONDANSETRON 4 MG PO TBDP
4.0000 mg | ORAL_TABLET | Freq: Once | ORAL | Status: AC | PRN
  Administered 2017-01-16: 4 mg via ORAL
  Filled 2017-01-15: qty 1

## 2017-01-15 NOTE — ED Provider Notes (Signed)
[facility]-EMERGENCY DEPT Provider Note   CSN: [id number] Arrival date & time: 01/15/17  2130  By signing my name below, I, Vista Mink, attest that this documentation has been prepared under the direction and in the presence of Shon Baton, MD. Electronically signed, Vista Mink, ED Scribe. 01/15/17. 11:49 PM.   History   Chief Complaint Chief Complaint  Patient presents with  . Abdominal Pain  . Generalized Body Aches  . Emesis  . Diarrhea    HPI HPI Comments: Aaron Barry is a 56 y.o. male, with Hx of GERD, brought in by ambulance, who presents to the Emergency Department complaining of 5/10 cramping abdominal pain with associated nausea, vomiting and diarrhea that started two days ago. He has been unable to tolerate PO's. No known sick contacts and believes it could be due to food poisoning. He notes a foul smell when belching which he states seems similar to when he had food poisoning in the past. Pt denies any fever but does note that he feels cold. He denies smoking but occasionally uses etOH. No Hx of DM, CKD, No fever, shortness of breath, cough.   The history is provided by the patient. No language interpreter was used.    Past Medical History:  Diagnosis Date  . Arthritis   . AV nodal re-entry tachycardia (HCC)   . GERD (gastroesophageal reflux disease)   . Hyperlipemia   . Spondylitis [facility])     Patient Active Problem List   Diagnosis Date Noted  . Chest pain 07/02/2012  . GERD (gastroesophageal reflux disease) 07/02/2012  . Hyperlipidemia 07/02/2012  . Leukocytosis 07/02/2012  . Elevated blood pressure (not hypertension) 07/02/2012  . Shortness of breath 07/02/2012  . SVT (supraventricular tachycardia) (HCC) 07/02/2012  . Depression 07/02/2012    Past Surgical History:  Procedure Laterality Date  . RADIOFREQUENCY ABLATION  1997,approx mid 90's   At [facility] for re-entry tachycardia        Home Medications    Prior to Admission medications   Medication  Sig Start Date End Date Taking? Authorizing Provider  atorvastatin (LIPITOR) 10 MG tablet Take 10 mg by mouth daily.    Historical Provider, MD  escitalopram (LEXAPRO) 10 MG tablet Take 10 mg by mouth 2 (two) times daily.    Historical Provider, MD  indomethacin (INDOCIN SR) 75 MG CR capsule Take 75 mg by mouth once.    Historical Provider, MD  loperamide (IMODIUM) 2 MG capsule Take 1 capsule (2 mg total) by mouth 4 (four) times daily as needed for diarrhea or loose stools. 01/16/17   Shon Baton, MD  Multiple Vitamin (MULTIVITAMIN WITH MINERALS) TABS Take 1 tablet by mouth daily.    Historical Provider, MD  ondansetron (ZOFRAN ODT) 4 MG disintegrating tablet Take 1 tablet (4 mg total) by mouth every 8 (eight) hours as needed for nausea or vomiting. 01/16/17   Shon Baton, MD    Family History History reviewed. No pertinent family history.  Social History Social History  Substance Use Topics  . Smoking status: Former Smoker    Years: 6.00    Quit date: 07/02/1997  . Smokeless tobacco: Never Used  . Alcohol use Yes     Comment: 24 - 36oz beer on the weekend     Allergies   Patient has no known allergies.   Review of Systems Review of Systems  Constitutional: Negative for fever.  Respiratory: Negative for cough and shortness of breath.   Gastrointestinal: Positive for abdominal pain, diarrhea,  nausea and vomiting.  All other systems reviewed and are negative.    Physical Exam Updated Vital Signs BP 129/90 (BP Location: Right Arm)   Pulse 65   Temp 98.3 F (36.8 C) (Oral)   Resp 16   Ht 6' (1.829 m)   Wt 230 lb (104.3 kg)   SpO2 96%   BMI 31.19 kg/m   Physical Exam  Constitutional: He is oriented to person, place, and time. He appears well-developed and well-nourished. No distress.  HENT:  Head: Normocephalic and atraumatic.  mucous membranes moist  Cardiovascular: Normal rate, regular rhythm and normal heart sounds.   No murmur heard. Pulmonary/Chest:  Effort normal and breath sounds normal. No respiratory distress. He has no wheezes.  Abdominal: Soft. Bowel sounds are normal. There is no tenderness. There is no rebound.  Musculoskeletal: He exhibits no edema.  Neurological: He is alert and oriented to person, place, and time.  Skin: Skin is warm and dry.  Psychiatric: He has a normal mood and affect.  Nursing note and vitals reviewed.    ED Treatments / Results  DIAGNOSTIC STUDIES: Oxygen Saturation is 98% on RA, normal by my interpretation.  COORDINATION OF CARE: 11:15 PM-Discussed treatment plan with pt at bedside and pt agreed to plan.   Labs (all labs ordered are listed, but only abnormal results are displayed) Labs Reviewed  CBC - Abnormal; Notable for the following:       Result Value   WBC 19.7 (*)    MCHC 36.1 (*)    All other components within normal limits  URINALYSIS, ROUTINE W REFLEX MICROSCOPIC - Abnormal; Notable for the following:    Color, Urine AMBER (*)    APPearance HAZY (*)    Ketones, ur 5 (*)    Protein, ur 30 (*)    Squamous Epithelial / LPF 0-5 (*)    All other components within normal limits  COMPREHENSIVE METABOLIC PANEL - Abnormal; Notable for the following:    Glucose, Bld 115 (*)    Calcium 8.7 (*)    All other components within normal limits  LIPASE, BLOOD    EKG  EKG Interpretation None       Radiology Dg Abdomen Acute W/chest  Result Date: 01/16/2017 CLINICAL DATA:  Acute onset of nausea and vomiting. Abdominal cramping. Initial encounter. EXAM: DG ABDOMEN ACUTE W/ 1V CHEST COMPARISON:  Chest radiograph performed 07/02/2012 FINDINGS: The lungs are well-aerated and clear. There is no evidence of focal opacification, pleural effusion or pneumothorax. The cardiomediastinal silhouette is within normal limits. The visualized bowel gas pattern is unremarkable. Scattered stool and air are seen within the colon; there is no evidence of small bowel dilatation to suggest obstruction. No free  intra-abdominal air is identified on the provided upright view. No acute osseous abnormalities are seen; the sacroiliac joints are unremarkable in appearance. IMPRESSION: 1. Unremarkable bowel gas pattern; no free intra-abdominal air seen. Small amount of stool noted in the colon. 2. No acute cardiopulmonary process seen. Electronically Signed   By: Roanna RaiderJeffery  Chang M.D.   On: 01/16/2017 00:39    Procedures Procedures (including critical care time)  Medications Ordered in ED Medications  ondansetron (ZOFRAN-ODT) disintegrating tablet 4 mg (4 mg Oral Given 01/16/17 0128)  sodium chloride 0.9 % bolus 1,000 mL (1,000 mLs Intravenous New Bag/Given 01/16/17 0128)  ondansetron (ZOFRAN) injection 4 mg (4 mg Intravenous Given 01/16/17 0126)  loperamide (IMODIUM) capsule 4 mg (4 mg Oral Given 01/16/17 0127)     Initial Impression /  Assessment and Plan / ED Course  I have reviewed the triage vital signs and the nursing notes.  Pertinent labs & imaging results that were available during my care of the patient were reviewed by me and considered in my medical decision making (see chart for details).  Clinical Course as of Jan 16 214  Wed Jan 16, 2017  0145 Patient improved with fluids. Workup is largely reassuring. He does have a nonspecific leukocytosis but this can be seen in a viral illness. Abdominal exam is benign. Patient has been to tolerate by mouth. Vital signs remained reassuring. Likely discharge home with supportive measures.  [CH]    Clinical Course User Index [CH] Shon Baton, MD    Patient presents with nausea, vomiting, diarrhea. Also reports crampy abdominal pain. Vital signs reassuring. Exam is largely benign. No point tenderness on abdominal exam or signs of peritonitis. Patient given fluids, Zofran, and Imodium. Lab work obtained. Nonspecific leukocytosis. See clinical course above. Doubt appendicitis or cholecystitis. Acute abdominal series does not show any evidence of  obstruction. Will treat with Zofran and Imodium at home for likely viral etiology. If patient has new symptoms, worsening pain or is unable to tolerate fluids, he needs to be reevaluated immediately.  After history, exam, and medical workup I feel the patient has been appropriately medically screened and is safe for discharge home. Pertinent diagnoses were discussed with the patient. Patient was given return precautions.   Final Clinical Impressions(s) / ED Diagnoses   Final diagnoses:  Nausea vomiting and diarrhea    New Prescriptions New Prescriptions   LOPERAMIDE (IMODIUM) 2 MG CAPSULE    Take 1 capsule (2 mg total) by mouth 4 (four) times daily as needed for diarrhea or loose stools.   ONDANSETRON (ZOFRAN ODT) 4 MG DISINTEGRATING TABLET    Take 1 tablet (4 mg total) by mouth every 8 (eight) hours as needed for nausea or vomiting.   I personally performed the services described in this documentation, which was scribed in my presence. The recorded information has been reviewed and is accurate.     Shon Baton, MD 01/16/17 [phone]

## 2017-01-15 NOTE — ED Triage Notes (Signed)
Per EMS, patient from home, c/o abdominal pain, N/V/D x24 hours. Denies chest pain and SOB. Ambulatory with EMS.

## 2017-01-16 LAB — COMPREHENSIVE METABOLIC PANEL
ALBUMIN: 4.8 g/dL (ref 3.5–5.0)
ALT: 19 U/L (ref 17–63)
AST: 22 U/L (ref 15–41)
Alkaline Phosphatase: 112 U/L (ref 38–126)
Anion gap: 9 (ref 5–15)
BILIRUBIN TOTAL: 1 mg/dL (ref 0.3–1.2)
BUN: 20 mg/dL (ref 6–20)
CO2: 24 mmol/L (ref 22–32)
Calcium: 8.7 mg/dL — ABNORMAL LOW (ref 8.9–10.3)
Chloride: 106 mmol/L (ref 101–111)
Creatinine, Ser: 1.14 mg/dL (ref 0.61–1.24)
GFR calc Af Amer: >60 mL/min (ref >60)
GFR calc non Af Amer: >60 mL/min (ref >60)
GLUCOSE: 115 mg/dL — AB (ref 65–99)
POTASSIUM: 3.6 mmol/L (ref 3.5–5.1)
Sodium: 139 mmol/L (ref 135–145)
Total Protein: 7.5 g/dL (ref 6.5–8.1)

## 2017-01-16 LAB — LIPASE, BLOOD: Lipase: 26 U/L (ref 11–51)

## 2017-01-16 MED ORDER — ONDANSETRON 4 MG PO TBDP: 4.0000 mg | ORAL_TABLET | Freq: Three times a day (TID) | ORAL | 0 refills | Status: AC | PRN

## 2017-01-16 MED ORDER — LOPERAMIDE HCL 2 MG PO CAPS: 2.0000 mg | ORAL_CAPSULE | Freq: Four times a day (QID) | ORAL | 0 refills | Status: AC | PRN

## 2017-01-16 NOTE — Discharge Instructions (Signed)
You were seen today for vomiting and diarrhea. This is likely related to a viral illness.  He will be given Zofran and Imodium for symptom control at home. Make sure to stay hydrated. Bland diet. If you have any new or worsening symptoms you need to be reevaluated.

## 2018-06-10 IMAGING — CR DG ABDOMEN ACUTE W/ 1V CHEST
4 series · 4 of 4 positions shown · non-contrast
Comparison: Chest radiograph performed 07/02/2012

CLINICAL DATA: Acute onset of nausea and vomiting. Abdominal
cramping. Initial encounter.

EXAM:
DG ABDOMEN ACUTE W/ 1V CHEST

[w chest pa]
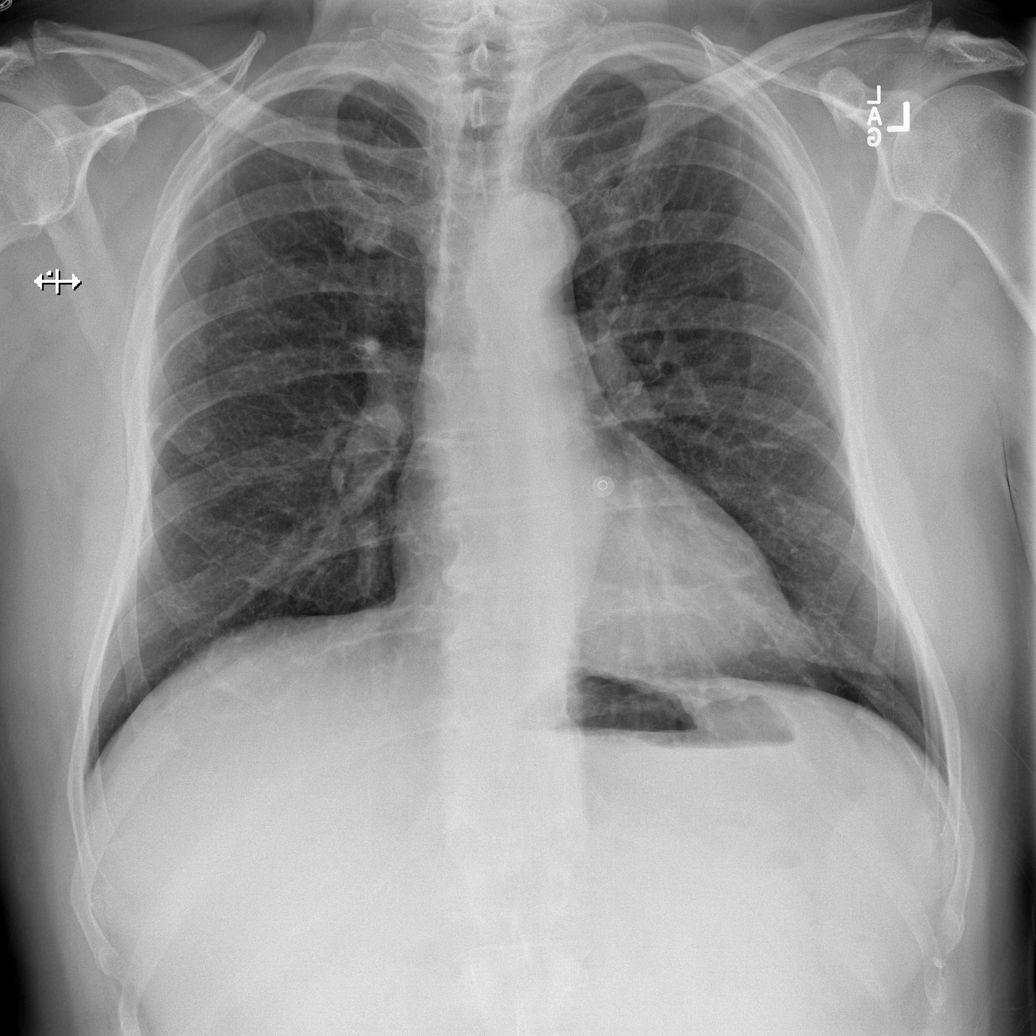

[w abdomen upright]
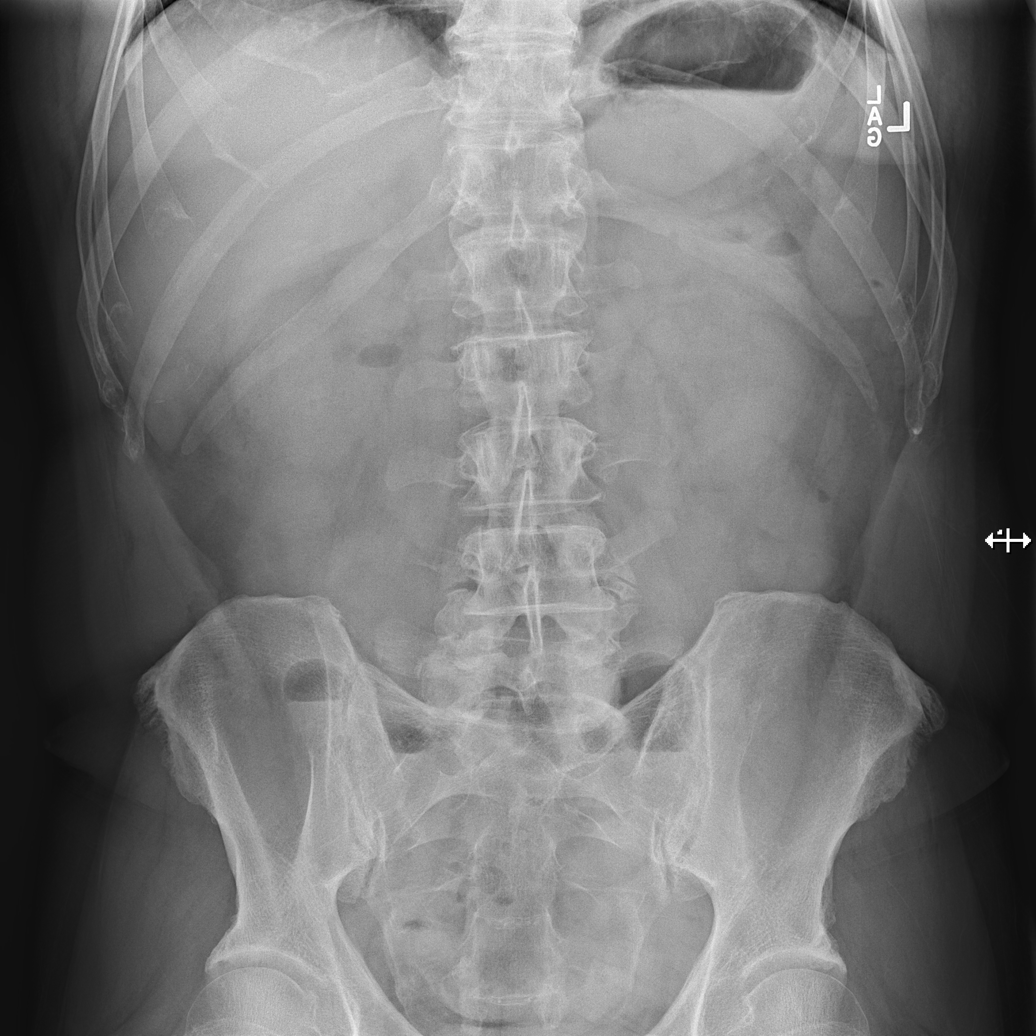

[t abdomen supine (1 of 2)]
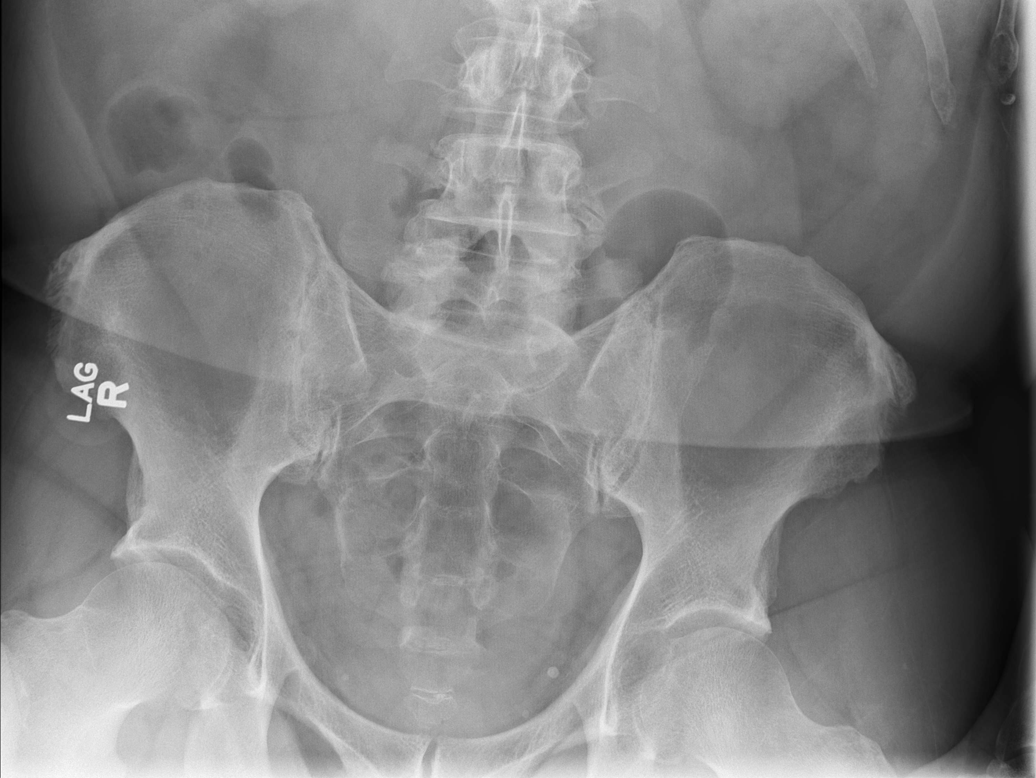

[t abdomen supine (2 of 2)]
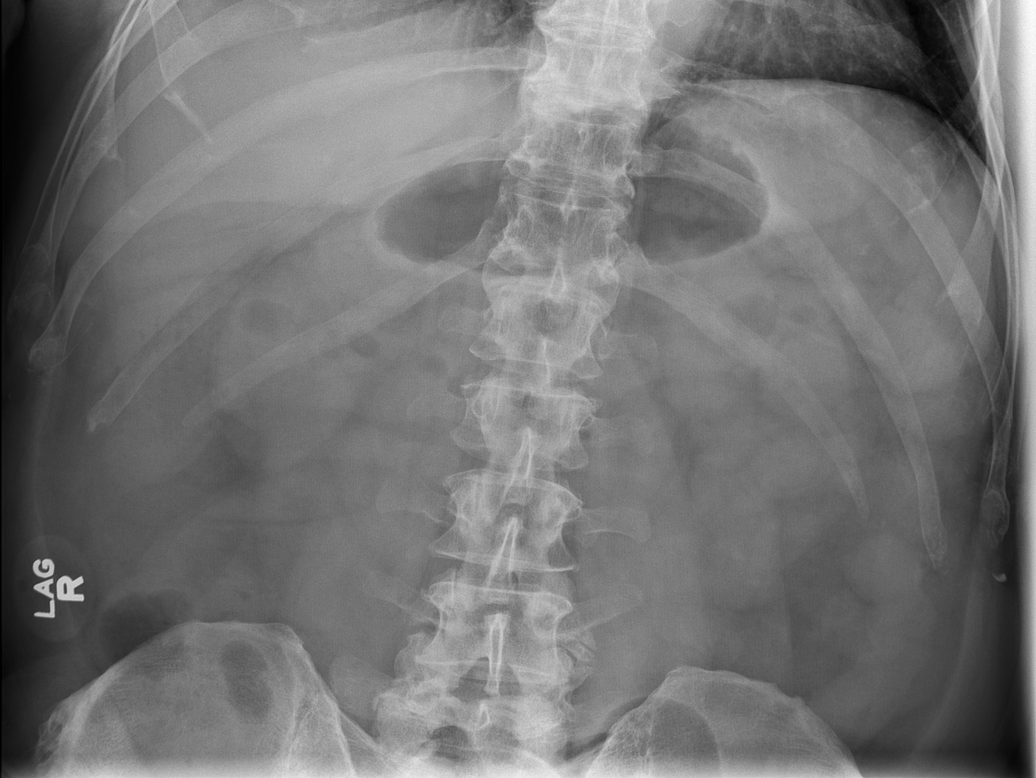

[4 of 4 positions shown; findings below may reference images not displayed]

FINDINGS: The lungs are well-aerated and clear. There is no evidence of focal
opacification, pleural effusion or pneumothorax. The
cardiomediastinal silhouette is within normal limits.

The visualized bowel gas pattern is unremarkable. Scattered stool
and air are seen within the colon; there is no evidence of small
bowel dilatation to suggest obstruction. No free intra-abdominal air
is identified on the provided upright view.

No acute osseous abnormalities are seen; the sacroiliac joints are
unremarkable in appearance.
IMPRESSION: 1. Unremarkable bowel gas pattern; no free intra-abdominal air seen.
Small amount of stool noted in the colon.
2. No acute cardiopulmonary process seen.
